# Patient Record
Sex: Female | Born: 1970 | Race: White | Hispanic: No | Smoking: Never smoker
Health system: Southern US, Community
[De-identification: ages and names within clinical notes are randomized; demographics above are authoritative.]

## PROBLEM LIST (undated history)

## (undated) DIAGNOSIS — O139 Gestational [pregnancy-induced] hypertension without significant proteinuria, unspecified trimester: Secondary | ICD-10-CM

## (undated) DIAGNOSIS — R011 Cardiac murmur, unspecified: Secondary | ICD-10-CM

## (undated) DIAGNOSIS — Z8774 Personal history of (corrected) congenital malformations of heart and circulatory system: Secondary | ICD-10-CM

## (undated) HISTORY — DX: Cardiac murmur, unspecified: R01.1

## (undated) HISTORY — DX: Personal history of (corrected) congenital malformations of heart and circulatory system: Z87.74

## (undated) HISTORY — DX: Gestational (pregnancy-induced) hypertension without significant proteinuria, unspecified trimester: O13.9

---

## 2008-06-04 ENCOUNTER — Other Ambulatory Visit: Admission: RE | Admit: 2008-06-04 | Discharge: 2008-06-04 | Payer: Self-pay | Admitting: Family Medicine

## 2008-07-30 ENCOUNTER — Ambulatory Visit (HOSPITAL_COMMUNITY): Admission: RE | Admit: 2008-07-30 | Discharge: 2008-07-30 | Payer: Self-pay | Admitting: Cardiology

## 2008-07-30 ENCOUNTER — Encounter (INDEPENDENT_AMBULATORY_CARE_PROVIDER_SITE_OTHER): Payer: Self-pay | Admitting: Cardiology

## 2008-09-17 ENCOUNTER — Encounter: Admission: RE | Admit: 2008-09-17 | Discharge: 2008-09-17 | Payer: Self-pay | Admitting: Cardiology

## 2012-02-27 ENCOUNTER — Other Ambulatory Visit (HOSPITAL_BASED_OUTPATIENT_CLINIC_OR_DEPARTMENT_OTHER): Payer: Self-pay | Admitting: Family Medicine

## 2012-02-27 DIAGNOSIS — R1031 Right lower quadrant pain: Secondary | ICD-10-CM

## 2012-02-27 DIAGNOSIS — R109 Unspecified abdominal pain: Secondary | ICD-10-CM

## 2012-02-28 ENCOUNTER — Other Ambulatory Visit (HOSPITAL_BASED_OUTPATIENT_CLINIC_OR_DEPARTMENT_OTHER): Payer: Self-pay

## 2012-02-29 ENCOUNTER — Ambulatory Visit (HOSPITAL_BASED_OUTPATIENT_CLINIC_OR_DEPARTMENT_OTHER)
Admission: RE | Admit: 2012-02-29 | Discharge: 2012-02-29 | Disposition: A | Discharge status: Home / Self Care | Payer: 59 | Source: Ambulatory Visit | Attending: Family Medicine | Admitting: Family Medicine

## 2012-02-29 DIAGNOSIS — R109 Unspecified abdominal pain: Secondary | ICD-10-CM | POA: Insufficient documentation

## 2012-02-29 DIAGNOSIS — K5730 Diverticulosis of large intestine without perforation or abscess without bleeding: Secondary | ICD-10-CM | POA: Insufficient documentation

## 2012-02-29 DIAGNOSIS — R1031 Right lower quadrant pain: Secondary | ICD-10-CM | POA: Insufficient documentation

## 2013-02-10 IMAGING — CT CT ABD-PELV W/O CM
2 of 4 series · 16 of 46 positions shown, 18 images · non-contrast
Comparison: None.

CLINICAL DATA: Right lower quadrant pain, abdominal pain

CT ABDOMEN AND PELVIS WITHOUT CONTRAST
TECHNIQUE: Multidetector CT imaging of the abdomen and pelvis was
performed following the standard protocol without intravenous
contrast.

[Series 2: renal stone < 200 lbs 5.0 b31f · axial · 0.88mm/px · z∈[-504,-49]mm · 13 of 101 slices shown, 15 images]
[im 5/101  soft-tissue]
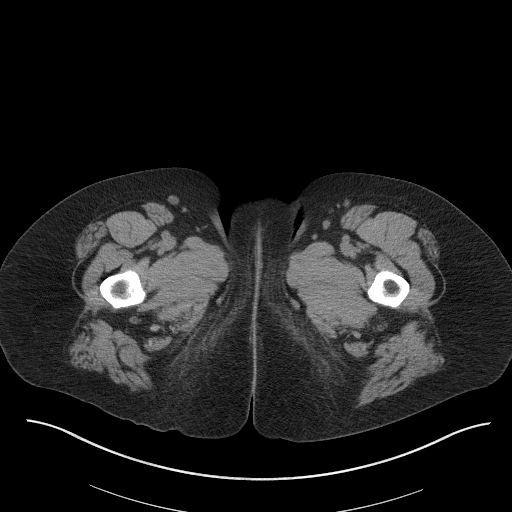
[im 5/101  bone]
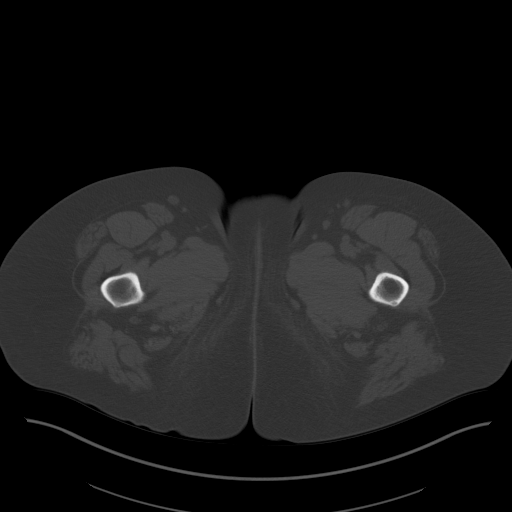
[im 13/101  soft-tissue]
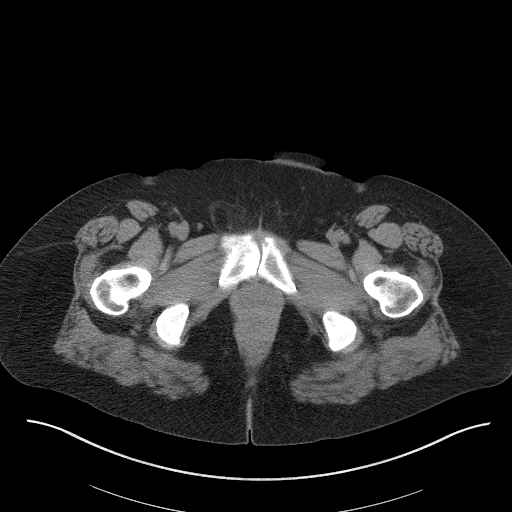
[im 21/101  soft-tissue]
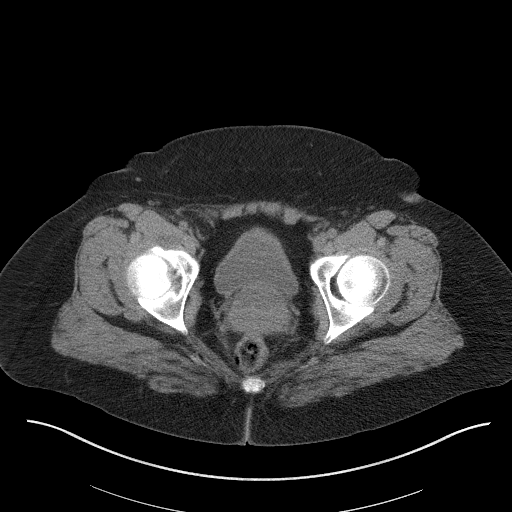
[im 30/101  soft-tissue]
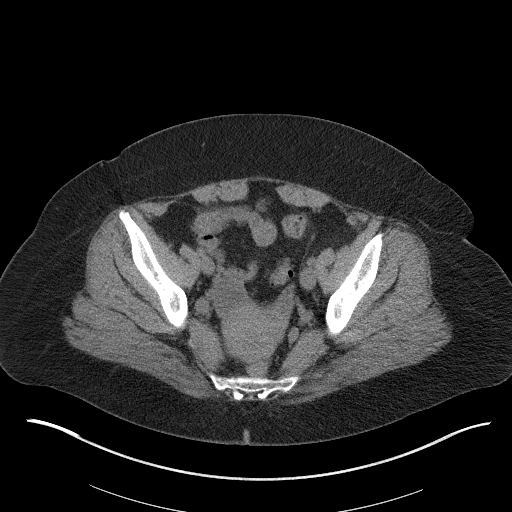
[im 34/101  soft-tissue]
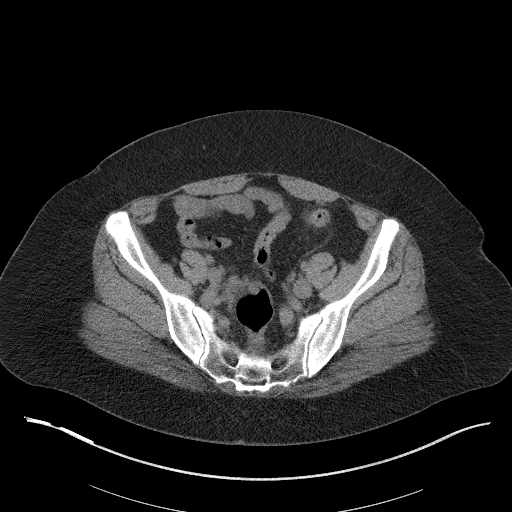
[im 42/101  soft-tissue]
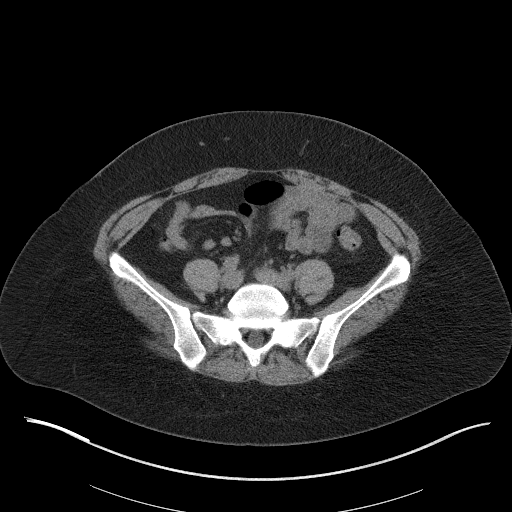
[im 51/101  soft-tissue]
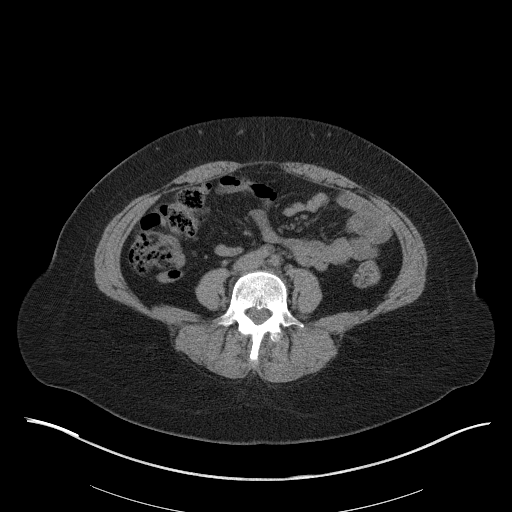
[im 59/101  soft-tissue]
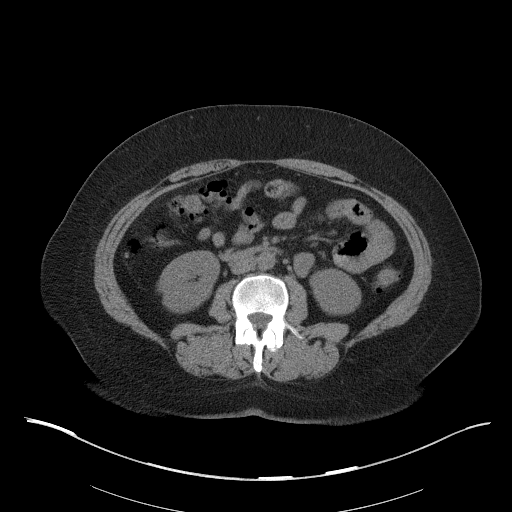
[im 67/101  soft-tissue]
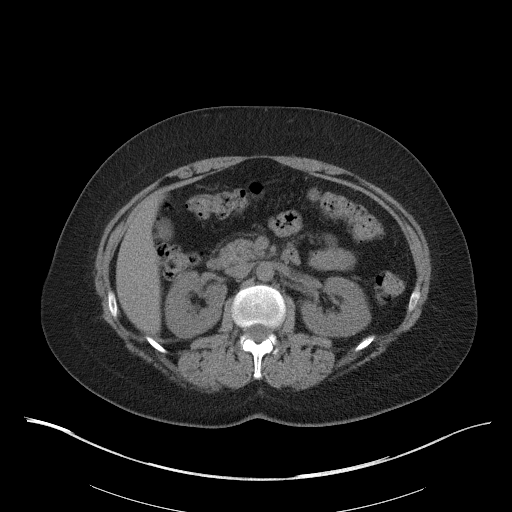
[im 67/101  bone]
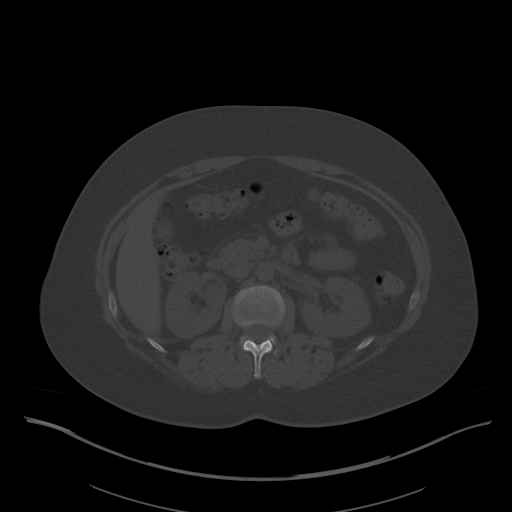
[im 71/101  soft-tissue]
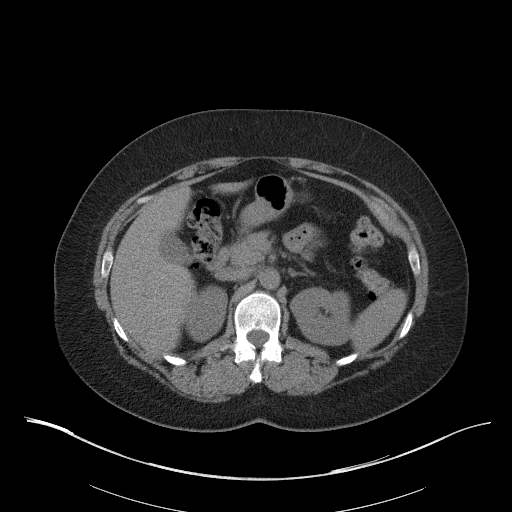
[im 80/101  soft-tissue]
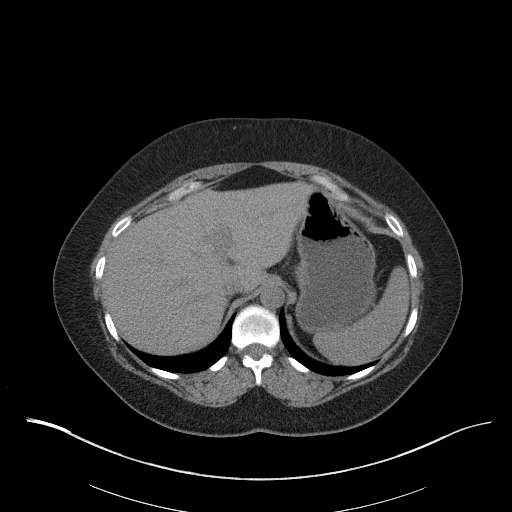
[im 88/101  soft-tissue]
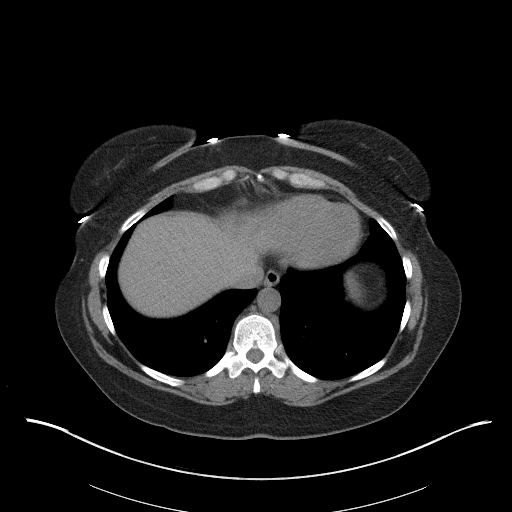
[im 96/101  soft-tissue]
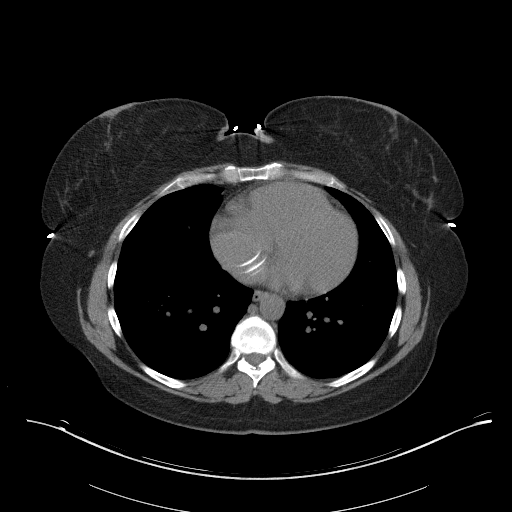

[Series 5: renal stone 3.0 coronal · coronal · 0.88mm/px · 3 of 100 slices shown]
[im 34/100  soft-tissue]
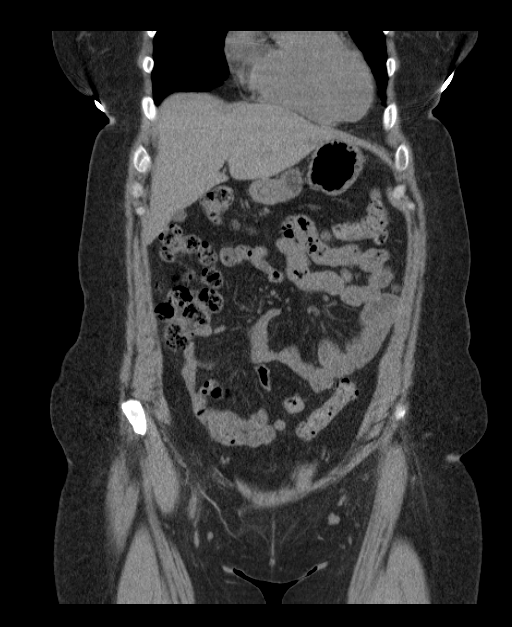
[im 45/100  soft-tissue]
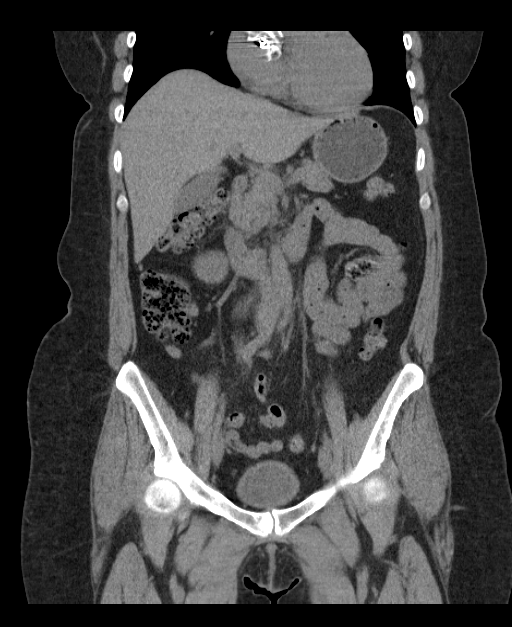
[im 56/100  soft-tissue]
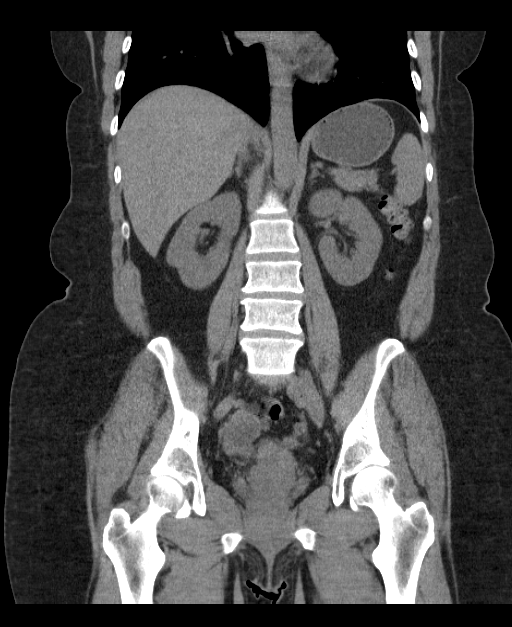

[16 of 46 positions shown; findings below may reference images not displayed]

FINDINGS: Lung bases are clear.  No pericardial fluid.

Non-IV contrast images demonstrate no focal hepatic lesion.
Gallbladder is normal.  There is mild haziness to the fat
surrounding the pancreatic head (image 29).  Pancreas parenchyma
appears normal on this noncontrast exam.  The spleen, adrenal
glands, and kidneys are normal.

The stomach, duodenum, small bowel, and cecum are normal.  Appendix
not identified.  No evidence of appendicitis.  There are several
diverticula of the descending colon and sigmoid colon without acute
inflammation.

Abdominal aorta is normal caliber.  No retroperitoneal or
periportal lymphadenopathy.

No free fluid the pelvis.  The uterus and ovaries are normal.  The
bladder is normal.  No pelvic lymphadenopathy. Review of  bone
windows demonstrates no aggressive osseous lesions.
IMPRESSION: 1..  Mild haziness surrounding pancreatic head is a nonspecific
finding.  Recommend correlation for pancreatitis or duodenitis.
2.  Colonic diverticulosis  without  diverticulitis.

## 2017-03-06 ENCOUNTER — Other Ambulatory Visit: Payer: Self-pay | Admitting: Family Medicine

## 2017-03-06 DIAGNOSIS — Z139 Encounter for screening, unspecified: Secondary | ICD-10-CM

## 2017-04-04 ENCOUNTER — Ambulatory Visit
Admission: RE | Admit: 2017-04-04 | Discharge: 2017-04-04 | Disposition: A | Discharge status: Home / Self Care | Payer: No Typology Code available for payment source | Source: Ambulatory Visit | Attending: Family Medicine | Admitting: Family Medicine

## 2017-04-04 DIAGNOSIS — Z139 Encounter for screening, unspecified: Secondary | ICD-10-CM

## 2017-05-17 ENCOUNTER — Encounter: Payer: Self-pay | Admitting: Cardiology

## 2017-05-17 ENCOUNTER — Ambulatory Visit (INDEPENDENT_AMBULATORY_CARE_PROVIDER_SITE_OTHER): Payer: No Typology Code available for payment source | Admitting: Cardiology

## 2017-05-17 VITALS — BP 128/88 | HR 72 | Ht 65.5 in | Wt 203.2 lb

## 2017-05-17 DIAGNOSIS — Q211 Atrial septal defect, unspecified: Secondary | ICD-10-CM

## 2017-05-17 DIAGNOSIS — Z8774 Personal history of (corrected) congenital malformations of heart and circulatory system: Secondary | ICD-10-CM | POA: Insufficient documentation

## 2017-05-17 DIAGNOSIS — R011 Cardiac murmur, unspecified: Secondary | ICD-10-CM

## 2017-05-17 DIAGNOSIS — O139 Gestational [pregnancy-induced] hypertension without significant proteinuria, unspecified trimester: Secondary | ICD-10-CM | POA: Insufficient documentation

## 2017-05-17 MED ORDER — ASPIRIN EC 81 MG PO TBEC: 81.0000 mg | DELAYED_RELEASE_TABLET | Freq: Every day | ORAL | 3 refills | Status: AC

## 2017-05-17 NOTE — Progress Notes (Signed)
Cardiology Office Note:    Date:  05/17/2017   ID:  Wendy CerisePamela K Legore, DOB 1971-03-22, MRN [id number]  PCP:  Lenell AntuLe, Thao P, DO  Cardiologist:  No primary care provider on file.   Referring MD: Lenell AntuLe, Thao P, DO     History of Present Illness:    Wendy Barnett is a 47 y.o. female  here for evaluation of prior atrial septal defect closure at the request of Dr. Conley RollsLe.  [facility] she had closure with occluder device.  She has been doing quite well, no fevers chills nausea vomiting syncope bleeding chest pain shortness of breath.  She lost over 100 pounds previously through physical activity, exercise, diet.  Unfortunately, has gone through a separation.  She has gained back some weight but she is down 20 pounds.  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  Her daughter is 3113.  She takes dental antibiotics.  LDL cholesterol 92 creatinine 0.72, hemoglobin 12.9  I saw her last in 2012.    Past Medical History:  Diagnosis Date  . ASD, spontaneous closure   . Heart murmur   . PIH (pregnancy induced hypertension)     Past Surgical History:  Procedure Laterality Date  . CESAREAN SECTION  2001, 2005    Current Medications: Current Meds  Medication Sig  . aspirin 325 MG tablet Take 325 mg by mouth daily.  . Multiple Vitamins-Minerals (MULTIVITAMIN ADULTS PO) Take by mouth daily.     Allergies:   Patient has no known allergies.   Social History   Socioeconomic History  . Marital status: Legally Separated    Spouse name: None  . Number of children: 2  . Years of education: None  . Highest education level: None  Social Needs  . Financial resource strain: None  . Food insecurity - worry: None  . Food insecurity - inability: None  . Transportation needs - medical: None  . Transportation needs - non-medical: None  Occupational History  . Occupation: house wife   Tobacco Use  . Smoking status: Never Smoker  . Smokeless tobacco: Never Used    Substance and Sexual Activity  . Alcohol use: Yes  . Drug use: No  . Sexual activity: None  Other Topics Concern  . None  Social History Narrative  . None     Family History: The patient's family history includes Cancer in her mother; Hyperlipidemia in her father; Prostate cancer in her father. There is no history of Breast cancer.  ROS:   Please see the history of present illness.     All other systems reviewed and are negative.  EKGs/Labs/Other Studies Reviewed:    The following studies were reviewed today: Prior office records, EKG, lab work  EKG:  EKG is ordered today.  The ekg ordered today demonstrates SB 56, no changes, personally reviewed.  Recent Labs: No results found for requested labs within last 8760 hours.  Recent Lipid Panel No results found for: CHOL, TRIG, HDL, CHOLHDL, VLDL, LDLCALC, LDLDIRECT  Physical Exam:    VS:  BP 128/88   Pulse 72   Ht 5' 5.5" (1.664 m)   Wt 203 lb 3.2 oz (92.2 kg)   SpO2 98%   BMI 33.30 kg/m     Wt Readings from Last 3 Encounters:  05/17/17 203 lb 3.2 oz (92.2 kg)     GEN:  Well nourished, well developed in no acute distress HEENT: Normal NECK: No JVD; No  carotid bruits LYMPHATICS: No lymphadenopathy CARDIAC: RRR, no murmurs, rubs, gallops RESPIRATORY:  Clear to auscultation without rales, wheezing or rhonchi  ABDOMEN: Soft, non-tender, non-distended MUSCULOSKELETAL:  No edema; No deformity  SKIN: Warm and dry NEUROLOGIC:  Alert and oriented x 3 PSYCHIATRIC:  Normal affect   ASSESSMENT:    1. Hx of percutaneous transcatheter closure of congenital ASD   2. Heart murmur   3. ASD (atrial septal defect)    PLAN:    In order of problems listed above:  ASD status post ASD closure device percutaneously in 2010 at [facility] to do well.  We will check an echocardiogram to ensure proper function. -Continue with dental antibiotics. -Okay to decrease aspirin to 81 mg a day.  Obesity -She is  continuing to work on diet and exercise.     Medication Adjustments/Labs and Tests Ordered: Current medicines are reviewed at length with the patient today.  Concerns regarding medicines are outlined above.  No orders of the defined types were placed in this encounter.  No orders of the defined types were placed in this encounter.   Signed, Donato Schultz, MD  05/17/2017 11:10 AM    [facility]

## 2017-05-17 NOTE — Patient Instructions (Signed)
Medication Instructions:  Please decrease your Asprin to 81 mg a day. Continue all other medications as listed.  Testing/Procedures: Your physician has requested that you have an echocardiogram. Echocardiography is a painless test that uses sound waves to create images of your heart. It provides your doctor with information about the size and shape of your heart and how well your heart's chambers and valves are working. This procedure takes approximately one hour. There are no restrictions for this procedure.  Follow-Up: Follow up in 2 years with Dr. Anne FuSkains.  You will receive a letter in the mail 2 months before you are due.  Please call us when you receive this letter to schedule your follow up appointment.  If you need a refill on your cardiac medications before your next appointment, please call your pharmacy.  Thank you for choosing [facility]!!

## 2017-05-24 ENCOUNTER — Other Ambulatory Visit: Payer: Self-pay

## 2017-05-24 ENCOUNTER — Ambulatory Visit (HOSPITAL_COMMUNITY): Payer: No Typology Code available for payment source | Attending: Internal Medicine

## 2017-05-24 DIAGNOSIS — Q211 Atrial septal defect, unspecified: Secondary | ICD-10-CM

## 2017-05-24 DIAGNOSIS — R011 Cardiac murmur, unspecified: Secondary | ICD-10-CM | POA: Diagnosis not present

## 2017-05-24 DIAGNOSIS — I351 Nonrheumatic aortic (valve) insufficiency: Secondary | ICD-10-CM | POA: Diagnosis not present

## 2017-05-24 DIAGNOSIS — Z8774 Personal history of (corrected) congenital malformations of heart and circulatory system: Secondary | ICD-10-CM | POA: Insufficient documentation

## 2019-03-19 ENCOUNTER — Telehealth: Payer: Self-pay | Admitting: Cardiology

## 2019-03-19 NOTE — Telephone Encounter (Signed)
New Message   Patient wants to know if need an echo before she sets up her next appt with Dr. Marlou Porch. Please advise.

## 2019-03-20 ENCOUNTER — Telehealth: Payer: Self-pay | Admitting: Cardiology

## 2019-03-20 NOTE — Telephone Encounter (Signed)
No need for echocardiogram before appointment.  Last echocardiogram reassuring. Wendy Furbish, MD

## 2019-03-20 NOTE — Telephone Encounter (Signed)
Please see previous phone note.  

## 2019-03-20 NOTE — Telephone Encounter (Signed)
Attempted to contact pt to make her aware she does not need a 2 D Echo this year per Dr Marlou Porch. NA and mailbox is full therefore unable to leave a message.

## 2019-03-20 NOTE — Telephone Encounter (Signed)
Pt last seen 05/17/2017 - was instructed to f/u in 2 years (due 05/2019).  She has 2 D Echo2/14/2019 demonstrating NL functioning ASD closure device, mild aortic regurg, overall reassuring.   Will have Dr Andre Lefort to determine if she needs another echo before seeing him back.

## 2019-03-20 NOTE — Telephone Encounter (Signed)
New Message  Pt is calling and wanting to talk to Dr. Marlou Porch nurse about how to schedule an appt.   Please call to discuss

## 2019-03-24 NOTE — Telephone Encounter (Signed)
Left message for patient that per Dr Marlou Porch she does not need a 2 D Echo before she appt with him this year.  Requested she call back if any questions or concerns.

## 2019-05-15 ENCOUNTER — Other Ambulatory Visit: Payer: Self-pay

## 2019-05-15 ENCOUNTER — Encounter: Payer: Self-pay | Admitting: Cardiology

## 2019-05-15 ENCOUNTER — Ambulatory Visit: Payer: BLUE CROSS/BLUE SHIELD | Admitting: Cardiology

## 2019-05-15 VITALS — BP 130/90 | HR 55 | Ht 65.5 in | Wt 140.0 lb

## 2019-05-15 DIAGNOSIS — Q211 Atrial septal defect, unspecified: Secondary | ICD-10-CM

## 2019-05-15 DIAGNOSIS — Z8774 Personal history of (corrected) congenital malformations of heart and circulatory system: Secondary | ICD-10-CM

## 2019-05-15 NOTE — Patient Instructions (Signed)
Medication Instructions:  The current medical regimen is effective;  continue present plan and medications.  *If you need a refill on your cardiac medications before your next appointment, please call your pharmacy*  Follow-Up: At [facility], you and your health needs are our priority.  As part of our continuing mission to provide you with exceptional heart care, we have created designated Provider Care Teams.  These Care Teams include your primary Cardiologist (physician) and Advanced Practice Providers (APPs -  Physician Assistants and Nurse Practitioners) who all work together to provide you with the care you need, when you need it.  Your next appointment:   12 month(s)  The format for your next appointment:   In Person  Provider:   Mark Skains, MD   Thank you for choosing [facility]!!     

## 2019-05-15 NOTE — Progress Notes (Signed)
Cardiology Office Note:    Date:  05/15/2019   ID:  Wendy Barnett, DOB March 13, 1971, MRN [id number]  PCP:  Glenford Bayley, DO  Cardiologist:  Candee Furbish, MD   Referring MD: Glenford Bayley, DO     History of Present Illness:    Wendy Barnett is a 49 y.o. female  here for follow-up of prior ASD closure with Amplatzer occluder device.  2010, [employer].  Lost over 100 pounds.  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  Daughter 33.  Dental antibiotics.  Echocardiogram in 2019 was reassuring.  Dropping weight during COVID.   Past Medical History:  Diagnosis Date  . ASD, spontaneous closure   . Heart murmur   . PIH (pregnancy induced hypertension)     Past Surgical History:  Procedure Laterality Date  . CESAREAN SECTION  2001, 2005    Current Medications: Current Meds  Medication Sig  . aspirin EC 81 MG tablet Take 1 tablet (81 mg total) by mouth daily.     Allergies:   Patient has no known allergies.   Social History   Socioeconomic History  . Marital status: Legally Separated    Spouse name: Not on file  . Number of children: 2  . Years of education: Not on file  . Highest education level: Not on file  Occupational History  . Occupation: house wife   Tobacco Use  . Smoking status: Never Smoker  . Smokeless tobacco: Never Used  Substance and Sexual Activity  . Alcohol use: Yes  . Drug use: No  . Sexual activity: Not on file  Other Topics Concern  . Not on file  Social History Narrative  . Not on file   Social Determinants of Health   Financial Resource Strain:   . Difficulty of Paying Living Expenses: Not on file  Food Insecurity:   . Worried About Charity fundraiser in the Last Year: Not on file  . Ran Out of Food in the Last Year: Not on file  Transportation Needs:   . Lack of Transportation (Medical): Not on file  . Lack of Transportation (Non-Medical): Not on file  Physical Activity:   . Days of Exercise per Week: Not on file  . Minutes of  Exercise per Session: Not on file  Stress:   . Feeling of Stress : Not on file  Social Connections:   . Frequency of Communication with Friends and Family: Not on file  . Frequency of Social Gatherings with Friends and Family: Not on file  . Attends Religious Services: Not on file  . Active Member of Clubs or Organizations: Not on file  . Attends Archivist Meetings: Not on file  . Marital Status: Not on file     Family History: The patient's family history includes Cancer in her mother; Hyperlipidemia in her father; Prostate cancer in her father. There is no history of Breast cancer.  ROS:   Please see the history of present illness.     All other systems reviewed and are negative.  EKGs/Labs/Other Studies Reviewed:    The following studies were reviewed today: Prior office records, EKG, lab work  Echocardiogram 05/24/2017: Normal functioning ASD closuring device. Mild aortic regurgitation. Overall reassuring.   EKG:  EKG is ordered today.  The ekg ordered today demonstrates SB 56, no changes, personally reviewed.  Recent Labs: No results found for requested labs within last 8760 hours.  Recent Lipid Panel No results found for: CHOL, TRIG,  HDL, CHOLHDL, VLDL, LDLCALC, LDLDIRECT  Physical Exam:    VS:  BP 130/90   Pulse (!) 55   Ht 5' 5.5" (1.664 m)   Wt 140 lb (63.5 kg)   BMI 22.94 kg/m     Wt Readings from Last 3 Encounters:  05/15/19 140 lb (63.5 kg)  05/17/17 203 lb 3.2 oz (92.2 kg)     GEN: Well nourished, well developed, in no acute distress  HEENT: normal  Neck: no JVD, carotid bruits, or masses Cardiac: RRR; no murmurs, rubs, or gallops,no edema  Respiratory:  clear to auscultation bilaterally, normal work of breathing GI: soft, nontender, nondistended, + BS MS: no deformity or atrophy  Skin: warm and dry, no rash Neuro:  Alert and Oriented x 3, Strength and sensation are intact Psych: euthymic mood, full affect   ASSESSMENT:    1. ASD  (atrial septal defect)   2. Hx of percutaneous transcatheter closure of congenital ASD    PLAN:    In order of problems listed above:  Amplatzer ASD closure device 2010 [facility] -Doing well.  Prior echocardiogram showed no evidence of shunting. -Dental antibiotics.  Aspirin 81 mg a day seems reasonable.   Medication Adjustments/Labs and Tests Ordered: Current medicines are reviewed at length with the patient today.  Concerns regarding medicines are outlined above.  Orders Placed This Encounter  Procedures  . EKG 12-Lead   No orders of the defined types were placed in this encounter.   Signed, Donato Schultz, MD  05/15/2019 12:23 PM    [facility]

## 2019-06-07 ENCOUNTER — Ambulatory Visit: Payer: BLUE CROSS/BLUE SHIELD | Attending: Internal Medicine

## 2019-06-07 DIAGNOSIS — Z23 Encounter for immunization: Secondary | ICD-10-CM | POA: Insufficient documentation

## 2019-06-07 NOTE — Progress Notes (Signed)
   Covid-19 Vaccination Clinic  Name:  Wendy Barnett    MRN: [id number] DOB: Dec 03, 1970  06/07/2019  [name] was observed post Covid-19 immunization for 15 minutes without incidence. She was provided with Vaccine Information Sheet and instruction to access the V-Safe system.   [name] was instructed to call 911 with any severe reactions post vaccine: Marland Kitchen Difficulty breathing  . Swelling of your face and throat  . A fast heartbeat  . A bad rash all over your body  . Dizziness and weakness    Immunizations Administered    Name Date Dose VIS Date Route   Pfizer COVID-19 Vaccine 06/07/2019  3:47 PM 0.3 mL 03/21/2019 Intramuscular   Manufacturer: [employer], [employer]   Lot: [id number]   NDC: [address]-7

## 2019-06-28 ENCOUNTER — Ambulatory Visit: Payer: BLUE CROSS/BLUE SHIELD | Attending: Internal Medicine

## 2019-06-28 DIAGNOSIS — Z23 Encounter for immunization: Secondary | ICD-10-CM

## 2019-06-28 NOTE — Progress Notes (Signed)
   Covid-19 Vaccination Clinic  Name:  Wendy Barnett    MRN: [id number] DOB: 09/18/1970  06/28/2019  [name] was observed post Covid-19 immunization for 15 minutes without incident. She was provided with Vaccine Information Sheet and instruction to access the V-Safe system.   [name] was instructed to call 911 with any severe reactions post vaccine: Marland Kitchen Difficulty breathing  . Swelling of face and throat  . A fast heartbeat  . A bad rash all over body  . Dizziness and weakness   Immunizations Administered    Name Date Dose VIS Date Route   Pfizer COVID-19 Vaccine 06/28/2019  3:38 PM 0.3 mL 03/21/2019 Intramuscular   Manufacturer: [employer], [employer]   Lot: [id number]   NDC: [address]-7

## 2020-05-24 ENCOUNTER — Telehealth: Payer: Self-pay | Admitting: Cardiology

## 2020-05-24 DIAGNOSIS — Q211 Atrial septal defect, unspecified: Secondary | ICD-10-CM

## 2020-05-24 DIAGNOSIS — I351 Nonrheumatic aortic (valve) insufficiency: Secondary | ICD-10-CM

## 2020-05-24 NOTE — Telephone Encounter (Signed)
Last echo was 05/2017  Normal functioning ASD closuring device. Mild aortic regurgitation. Overall reassuring.  Donato Schultz, MD  appt scheduled with MS 05/31/2020.  Will have MS to review for orders but highly doubtful we could schedule the echo before she upcoming appt.

## 2020-05-24 NOTE — Telephone Encounter (Signed)
Patient wanted to know if she needed an Echo before her next appointment with Dr. Anne Fu. Please advise

## 2020-05-26 NOTE — Telephone Encounter (Signed)
Please order ECHO (aortic regurgitation and prior ASD closure) OK if can't get prior to appt.  Donato Schultz, MD

## 2020-05-26 NOTE — Telephone Encounter (Signed)
Order has been placed for echo as instructed.

## 2020-05-28 NOTE — Telephone Encounter (Signed)
Echo has been scheduled for 06/17/20

## 2020-05-31 ENCOUNTER — Ambulatory Visit: Payer: BLUE CROSS/BLUE SHIELD | Admitting: Cardiology

## 2020-06-17 ENCOUNTER — Ambulatory Visit (HOSPITAL_COMMUNITY): Payer: BLUE CROSS/BLUE SHIELD | Attending: Cardiology

## 2020-06-17 ENCOUNTER — Other Ambulatory Visit: Payer: Self-pay

## 2020-06-17 ENCOUNTER — Telehealth: Payer: Self-pay | Admitting: Cardiology

## 2020-06-17 DIAGNOSIS — Q211 Atrial septal defect, unspecified: Secondary | ICD-10-CM

## 2020-06-17 DIAGNOSIS — I351 Nonrheumatic aortic (valve) insufficiency: Secondary | ICD-10-CM | POA: Insufficient documentation

## 2020-06-17 NOTE — Telephone Encounter (Signed)
    Pt has to r/s her appt with Dr. Anne Fu due to conflict in her schedule at work. She wanted to request once her echo result available if Dr. Anne Fu or his nurse can give her an in-depth explanation of her result

## 2020-06-18 LAB — ECHOCARDIOGRAM COMPLETE
Area-P 1/2: 3.99 cm2
P 1/2 time: 717 msec
S' Lateral: 3.2 cm

## 2020-06-18 NOTE — Telephone Encounter (Signed)
The patient had her echo 06/17/20. Her appointment with Dr. Anne Fu in now in May. Reiterated to her that she will be called to discuss echo results and will not have to wait until her May appointment. She was grateful for call and agrees with plan.

## 2020-06-21 ENCOUNTER — Other Ambulatory Visit (HOSPITAL_COMMUNITY): Payer: BLUE CROSS/BLUE SHIELD

## 2020-06-21 NOTE — Telephone Encounter (Signed)
Patient is calling back for echo results Florentina Addison called her today

## 2020-07-15 ENCOUNTER — Ambulatory Visit: Payer: BLUE CROSS/BLUE SHIELD | Admitting: Cardiology

## 2020-08-23 ENCOUNTER — Encounter: Payer: Self-pay | Admitting: Cardiology

## 2020-08-23 ENCOUNTER — Ambulatory Visit (INDEPENDENT_AMBULATORY_CARE_PROVIDER_SITE_OTHER): Payer: BLUE CROSS/BLUE SHIELD | Admitting: Cardiology

## 2020-08-23 ENCOUNTER — Other Ambulatory Visit: Payer: Self-pay

## 2020-08-23 VITALS — BP 120/90 | HR 45 | Ht 65.0 in | Wt 160.0 lb

## 2020-08-23 DIAGNOSIS — Q211 Atrial septal defect, unspecified: Secondary | ICD-10-CM

## 2020-08-23 DIAGNOSIS — I351 Nonrheumatic aortic (valve) insufficiency: Secondary | ICD-10-CM

## 2020-08-23 DIAGNOSIS — Z8774 Personal history of (corrected) congenital malformations of heart and circulatory system: Secondary | ICD-10-CM | POA: Diagnosis not present

## 2020-08-23 NOTE — Progress Notes (Signed)
Cardiology Office Note:    Date:  08/23/2020   ID:  Wendy Barnett, DOB 09-28-1970, MRN [id number]  PCP:  Lenell Antu, DO   [facility] Providers Cardiologist:  Donato Schultz, MD     Referring MD: Lenell Antu, DO    History of Present Illness:    Wendy Barnett is a 50 y.o. female here to discuss results of echocardiogram performed on 06/17/2020.  ASD closure with Amplatzer occluder device.  2010, [employer].  Lost over 100 pounds.  Owner of theater production company.  Daughter teen.  Dental antibiotics.  Echocardiogram in 2019 and 2022 was reassuring.  I had originally seen her in 2012.  Overall she is doing well, no fevers chills nausea vomiting syncope bleeding.  Has been maintaining weight loss.  Going up and down stairs at work.  Feels well.    Past Medical History:  Diagnosis Date  . ASD, spontaneous closure   . Heart murmur   . PIH (pregnancy induced hypertension)     Past Surgical History:  Procedure Laterality Date  . CESAREAN SECTION  2001, 2005    Current Medications: Current Meds  Medication Sig  . aspirin EC 81 MG tablet Take 1 tablet (81 mg total) by mouth daily.     Allergies:   Patient has no known allergies.   Social History   Socioeconomic History  . Marital status: Legally Separated    Spouse name: Not on file  . Number of children: 2  . Years of education: Not on file  . Highest education level: Not on file  Occupational History  . Occupation: house wife   Tobacco Use  . Smoking status: Never Smoker  . Smokeless tobacco: Never Used  Vaping Use  . Vaping Use: Never used  Substance and Sexual Activity  . Alcohol use: Yes  . Drug use: No  . Sexual activity: Not on file  Other Topics Concern  . Not on file  Social History Narrative  . Not on file   Social Determinants of Health   Financial Resource Strain: Not on file  Food Insecurity: Not on file  Transportation Needs: Not on file  Physical Activity: Not on file   Stress: Not on file  Social Connections: Not on file     Family History: The patient's family history includes Cancer in her mother; Hyperlipidemia in her father; Prostate cancer in her father. There is no history of Breast cancer.  ROS:   Please see the history of present illness.     All other systems reviewed and are negative.  EKGs/Labs/Other Studies Reviewed:    The following studies were reviewed today:  ECHO 06/17/20:  1. Left ventricular ejection fraction, by estimation, is 60 to 65%. The  left ventricle has normal function. The left ventricle has no regional  wall motion abnormalities. Left ventricular diastolic parameters are  consistent with Grade I diastolic  dysfunction (impaired relaxation). The average left ventricular global  longitudinal strain is -25.6 %. The global longitudinal strain is normal.  2. Right ventricular systolic function is normal. The right ventricular  size is normal.  3. Left atrial size was mildly dilated.  4. Amplatzer device in place, no shunting.  5. The mitral valve is normal in structure. Trivial mitral valve  regurgitation. No evidence of mitral stenosis.  6. The aortic valve is normal in structure. Aortic valve regurgitation is  mild. No aortic stenosis is present.  7. The inferior vena cava is normal in  size with greater than 50%  respiratory variability, suggesting right atrial pressure of 3 mmHg.   Comparison(s): 05/24/17 EF 55-60%. Mild AI.   EKG:  EKG is  ordered today.  The ekg ordered today demonstrates sinus bradycardia 45 with no other changes.  Recent Labs: No results found for requested labs within last 8760 hours.  Recent Lipid Panel No results found for: CHOL, TRIG, HDL, CHOLHDL, VLDL, LDLCALC, LDLDIRECT   Risk Assessment/Calculations:      Physical Exam:    VS:  BP 120/90   Pulse (!) 45   Ht 5\' 5"  (1.651 m)   Wt 160 lb (72.6 kg)   SpO2 99%   BMI 26.63 kg/m     Wt Readings from Last 3 Encounters:   08/23/20 160 lb (72.6 kg)  05/15/19 140 lb (63.5 kg)  05/17/17 203 lb 3.2 oz (92.2 kg)     GEN:  Well nourished, well developed in no acute distress HEENT: Normal NECK: No JVD; No carotid bruits LYMPHATICS: No lymphadenopathy CARDIAC: RRR, no murmurs, rubs, gallops RESPIRATORY:  Clear to auscultation without rales, wheezing or rhonchi  ABDOMEN: Soft, non-tender, non-distended MUSCULOSKELETAL:  No edema; No deformity  SKIN: Warm and dry NEUROLOGIC:  Alert and oriented x 3 PSYCHIATRIC:  Normal affect   ASSESSMENT:    1. ASD (atrial septal defect)   2. Nonrheumatic aortic valve insufficiency   3. Hx of percutaneous transcatheter closure of congenital ASD    PLAN:    In order of problems listed above:  ASD closure - Excellent.  No evidence of shunt on echocardiogram.  Personally reviewed and interpreted.  Aortic regurgitation - Mild aortic regurgitation.  Excellent.  Sinus bradycardia - EKG personally reviewed and interpreted shows heart rate of 45 bpm..  She is on no AV nodal blocking agents.  Doing well.  Asymptomatic with this.  Weight - Excellent job.  Continue with low-dose aspirin 81 mg medical management given prior closure device, Amplatz and dental prophylaxis as well.  She has not had any complications/bleeding.  1 year follow-up.   Medication Adjustments/Labs and Tests Ordered: Current medicines are reviewed at length with the patient today.  Concerns regarding medicines are outlined above.  Orders Placed This Encounter  Procedures  . EKG 12-Lead   No orders of the defined types were placed in this encounter.   Patient Instructions  Medication Instructions:  Your physician recommends that you continue on your current medications as directed. Please refer to the Current Medication list given to you today.  *If you need a refill on your cardiac medications before your next appointment, please call your pharmacy*   Lab Work: None If you have labs  (blood work) drawn today and your tests are completely normal, you will receive your results only by: 07/15/17 MyChart Message (if you have MyChart) OR . A paper copy in the mail If you have any lab test that is abnormal or we need to change your treatment, we will call you to review the results.   Testing/Procedures: None   Follow-Up: At [facility], you and your health needs are our priority.  As part of our continuing mission to provide you with exceptional heart care, we have created designated Provider Care Teams.  These Care Teams include your primary Cardiologist (physician) and Advanced Practice Providers (APPs -  Physician Assistants and Nurse Practitioners) who all work together to provide you with the care you need, when you need it.  We recommend signing up for the patient portal  called "MyChart".  Sign up information is provided on this After Visit Summary.  MyChart is used to connect with patients for Virtual Visits (Telemedicine).  Patients are able to view lab/test results, encounter notes, upcoming appointments, etc.  Non-urgent messages can be sent to your provider as well.   To learn more about what you can do with MyChart, go to [email or web address].    Your next appointment:   1 year(s)  The format for your next appointment:   In Person  Provider:   You may see Donato Schultz, MD or one of the following Advanced Practice Providers on your designated Care Team:    Georgie Chard, NP    Other Instructions      Signed, Donato Schultz, MD  08/23/2020 9:28 AM    [facility]

## 2020-08-23 NOTE — Patient Instructions (Signed)
Medication Instructions:  Your physician recommends that you continue on your current medications as directed. Please refer to the Current Medication list given to you today.  *If you need a refill on your cardiac medications before your next appointment, please call your pharmacy*   Lab Work: None If you have labs (blood work) drawn today and your tests are completely normal, you will receive your results only by: . MyChart Message (if you have MyChart) OR . A paper copy in the mail If you have any lab test that is abnormal or we need to change your treatment, we will call you to review the results.   Testing/Procedures: None   Follow-Up: At [facility], you and your health needs are our priority.  As part of our continuing mission to provide you with exceptional heart care, we have created designated Provider Care Teams.  These Care Teams include your primary Cardiologist (physician) and Advanced Practice Providers (APPs -  Physician Assistants and Nurse Practitioners) who all work together to provide you with the care you need, when you need it.  We recommend signing up for the patient portal called "MyChart".  Sign up information is provided on this After Visit Summary.  MyChart is used to connect with patients for Virtual Visits (Telemedicine).  Patients are able to view lab/test results, encounter notes, upcoming appointments, etc.  Non-urgent messages can be sent to your provider as well.   To learn more about what you can do with MyChart, go to [email or web address].    Your next appointment:   1 year(s)  The format for your next appointment:   In Person  Provider:   You may see Mark Skains, MD or one of the following Advanced Practice Providers on your designated Care Team:    Jill McDaniel, NP    Other Instructions   

## 2020-11-29 ENCOUNTER — Telehealth: Payer: Self-pay | Admitting: Cardiology

## 2020-11-29 NOTE — Telephone Encounter (Signed)
Pt of Dr Anne Fu with history of ASD closure with Amplatzer occluder device.  Pt states she has had an elbow injury and was prescribed a steroid dose pack and advised she could take the dose pack or Ibuprofen.  Pt states her elbow is very swollen.  Pt is calling to ask if it is OK for her to take either the steroid dose pack or the Ibuprofen for her injury with her history and having to take Aspirin.   Pt advised Dr Anne Fu is out of the office this week but will forward to our pharmacy team for review and recommendation.  Pt verbalizes understanding and agrees with current plan.

## 2020-11-29 NOTE — Telephone Encounter (Signed)
Patient called in with question concerning medication. Please advise

## 2020-11-30 NOTE — Telephone Encounter (Signed)
Ok to take either one short term, but I think the steroid back would be the safer option given she is on ASA.

## 2020-11-30 NOTE — Telephone Encounter (Signed)
Left message for patient to call back  

## 2020-11-30 NOTE — Telephone Encounter (Signed)
Spoke with the patient and gave her recommendations from PharmD. Patient verbalized understanding 

## 2020-11-30 NOTE — Telephone Encounter (Signed)
Patient was returning call 

## 2022-01-24 ENCOUNTER — Encounter: Payer: Self-pay | Admitting: Physician Assistant

## 2022-01-24 ENCOUNTER — Ambulatory Visit: Payer: BLUE CROSS/BLUE SHIELD | Attending: Physician Assistant | Admitting: Physician Assistant

## 2022-01-24 VITALS — BP 150/90 | HR 58 | Ht 66.0 in | Wt 211.2 lb

## 2022-01-24 DIAGNOSIS — I351 Nonrheumatic aortic (valve) insufficiency: Secondary | ICD-10-CM | POA: Diagnosis not present

## 2022-01-24 DIAGNOSIS — Q211 Atrial septal defect, unspecified: Secondary | ICD-10-CM

## 2022-01-24 DIAGNOSIS — R030 Elevated blood-pressure reading, without diagnosis of hypertension: Secondary | ICD-10-CM | POA: Diagnosis not present

## 2022-01-24 NOTE — Progress Notes (Signed)
Cardiology Office Note:    Date:  01/24/2022   ID:  Wendy Barnett, DOB June 28, 1970, MRN [id number]  PCP:  Lenell Antu, DO  [facility] Cardiologist:  Donato Schultz, MD  [facility] Electrophysiologist:  None   Chief Complaint: 18 months follow up   History of Present Illness:    Wendy Barnett is a 51 y.o. female with a hx of ASD s/p closure with Amplatzer occluder device (2010, [employer]), mild aortic regurgitation and sinus bradycardia seen for follow-up.  Echocardiogram 2022 showed normal LV function at 60 to 65%, grade 1 diastolic dysfunction, mild aortic regurgitation.  ASD device in place without shunt.  Last seen by Dr. Anne Fu March 2022.  Noted sinus bradycardia without AV nodal ablation gated.  Patient is here for follow-up.  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  Denies chest pain, shortness of breath, orthopnea, PND, syncope, lower extremity edema or melena.  Had minimally elevated blood pressure at PCP office.  Blood pressure elevated here.  She may have underlying stress/anxiety.  Past Medical History:  Diagnosis Date   ASD, spontaneous closure    Heart murmur    PIH (pregnancy induced hypertension)     Past Surgical History:  Procedure Laterality Date   CESAREAN SECTION  2001, 2005    Current Medications: Current Meds  Medication Sig   aspirin EC 81 MG tablet Take 1 tablet (81 mg total) by mouth daily.     Allergies:   Patient has no known allergies.   Social History   Socioeconomic History   Marital status: Legally Separated    Spouse name: Not on file   Number of children: 2   Years of education: Not on file   Highest education level: Not on file  Occupational History   Occupation: house wife   Tobacco Use   Smoking status: Never   Smokeless tobacco: Never  Vaping Use   Vaping Use: Never used  Substance and Sexual Activity   Alcohol use: Yes   Drug use: No   Sexual activity: Not on file   Other Topics Concern   Not on file  Social History Narrative   Not on file   Social Determinants of Health   Financial Resource Strain: Not on file  Food Insecurity: Not on file  Transportation Needs: Not on file  Physical Activity: Unknown (05/17/2017)   Exercise Vital Sign    Days of Exercise per Week: 6 days    Minutes of Exercise per Session: Not on file  Stress: Not on file  Social Connections: Not on file     Family History: The patient's family history includes Cancer in her mother; Hyperlipidemia in her father; Prostate cancer in her father. There is no history of Breast cancer.    ROS:   Please see the history of present illness.    All other systems reviewed and are negative.   EKGs/Labs/Other Studies Reviewed:    The following studies were reviewed today:  ECHO 06/17/20:  1. Left ventricular ejection fraction, by estimation, is 60 to 65%. The  left ventricle has normal function. The left ventricle has no regional  wall motion abnormalities. Left ventricular diastolic parameters are  consistent with Grade I diastolic  dysfunction (impaired relaxation). The average left ventricular global  longitudinal strain is -25.6 %. The global longitudinal strain is normal.   2. Right ventricular systolic function is normal. The right ventricular  size is normal.  3. Left atrial size was mildly dilated.   4. Amplatzer device in place, no shunting.   5. The mitral valve is normal in structure. Trivial mitral valve  regurgitation. No evidence of mitral stenosis.   6. The aortic valve is normal in structure. Aortic valve regurgitation is  mild. No aortic stenosis is present.   7. The inferior vena cava is normal in size with greater than 50%  respiratory variability, suggesting right atrial pressure of 3 mmHg.   Comparison(s): 05/24/17 EF 55-60%. Mild AI.   EKG:  EKG is  ordered today.  The ekg ordered today demonstrates normal sinus rhythm  Recent Labs: No results found  for requested labs within last 365 days.  Recent Lipid Panel No results found for: "CHOL", "TRIG", "HDL", "CHOLHDL", "VLDL", "LDLCALC", "LDLDIRECT"   Physical Exam:    VS:  BP (!) 150/90   Pulse (!) 58   Ht 5\' 6"  (1.676 m)   Wt 211 lb 3.2 oz (95.8 kg)   SpO2 98%   BMI 34.09 kg/m     Wt Readings from Last 3 Encounters:  01/24/22 211 lb 3.2 oz (95.8 kg)  08/23/20 160 lb (72.6 kg)  05/15/19 140 lb (63.5 kg)     GEN:  Well nourished, well developed in no acute distress HEENT: Normal NECK: No JVD; No carotid bruits LYMPHATICS: No lymphadenopathy CARDIAC: RRR, no murmurs, rubs, gallops RESPIRATORY:  Clear to auscultation without rales, wheezing or rhonchi  ABDOMEN: Soft, non-tender, non-distended MUSCULOSKELETAL:  No edema; No deformity  SKIN: Warm and dry NEUROLOGIC:  Alert and oriented x 3 PSYCHIATRIC:  Normal affect   ASSESSMENT AND PLAN:    Elevated blood pressure without hypertension Patient with elevated blood pressure today 140/88>>150/90.  Also had elevated blood pressure during office visit with PCP. Discussed pathophysiology of elevated blood pressure/hypertension.  She is hesitant to start medication.  Discussed low-sodium diet, weight loss and regular exercise.  She will get blood pressure cuff and send 07/13/19 readings for review.  Discussed when to take blood pressure. Hx of elevated blood pressure during pregnancy.   2.  ASD s/p closure -No residual shunt on echocardiogram last year  Medication Adjustments/Labs and Tests Ordered: Current medicines are reviewed at length with the patient today.  Concerns regarding medicines are outlined above.  Orders Placed This Encounter  Procedures   EKG 12-Lead   No orders of the defined types were placed in this encounter.   Patient Instructions  Medication Instructions:  Your physician recommends that you continue on your current medications as directed. Please refer to the Current Medication list given to you today. *If  you need a refill on your cardiac medications before your next appointment, please call your pharmacy*   Lab Work: None Ordered   Testing/Procedures: None Ordered   Follow-Up: At [facility], you and your health needs are our priority.  As part of our continuing mission to provide you with exceptional heart care, we have created designated Provider Care Teams.  These Care Teams include your primary Cardiologist (physician) and Advanced Practice Providers (APPs -  Physician Assistants and Nurse Practitioners) who all work together to provide you with the care you need, when you need it.  We recommend signing up for the patient portal called "MyChart".  Sign up information is provided on this After Visit Summary.  MyChart is used to connect with patients for Virtual Visits (Telemedicine).  Patients are able to view lab/test results, encounter notes, upcoming appointments, etc.  Non-urgent  messages can be sent to your provider as well.   To learn more about what you can do with MyChart, go to [email or web address].    Your next appointment:   2 month(s)  The format for your next appointment:   In Person  Provider:   Melina Copa, PA-C        Other Instructions   Important Information About Sugar         Signed, Leanor Kail, PA  01/24/2022 3:15 PM    [facility]

## 2022-01-24 NOTE — Patient Instructions (Signed)
Medication Instructions:  Your physician recommends that you continue on your current medications as directed. Please refer to the Current Medication list given to you today. *If you need a refill on your cardiac medications before your next appointment, please call your pharmacy*   Lab Work: None Ordered   Testing/Procedures: None Ordered   Follow-Up: At [facility], you and your health needs are our priority.  As part of our continuing mission to provide you with exceptional heart care, we have created designated Provider Care Teams.  These Care Teams include your primary Cardiologist (physician) and Advanced Practice Providers (APPs -  Physician Assistants and Nurse Practitioners) who all work together to provide you with the care you need, when you need it.  We recommend signing up for the patient portal called "MyChart".  Sign up information is provided on this After Visit Summary.  MyChart is used to connect with patients for Virtual Visits (Telemedicine).  Patients are able to view lab/test results, encounter notes, upcoming appointments, etc.  Non-urgent messages can be sent to your provider as well.   To learn more about what you can do with MyChart, go to [email or web address].    Your next appointment:   2 month(s)  The format for your next appointment:   In Person  Provider:   Melina Copa, PA-C        Other Instructions   Important Information About Sugar

## 2022-03-20 ENCOUNTER — Ambulatory Visit: Payer: BLUE CROSS/BLUE SHIELD | Admitting: Physician Assistant

## 2022-03-24 ENCOUNTER — Ambulatory Visit: Payer: BLUE CROSS/BLUE SHIELD | Admitting: Cardiology

## 2022-05-18 DIAGNOSIS — N72 Inflammatory disease of cervix uteri: Secondary | ICD-10-CM | POA: Diagnosis not present

## 2022-05-18 DIAGNOSIS — N959 Unspecified menopausal and perimenopausal disorder: Secondary | ICD-10-CM | POA: Diagnosis not present

## 2022-05-18 DIAGNOSIS — N841 Polyp of cervix uteri: Secondary | ICD-10-CM | POA: Diagnosis not present

## 2022-08-14 ENCOUNTER — Emergency Department (HOSPITAL_BASED_OUTPATIENT_CLINIC_OR_DEPARTMENT_OTHER): Payer: 59

## 2022-08-14 ENCOUNTER — Encounter (HOSPITAL_BASED_OUTPATIENT_CLINIC_OR_DEPARTMENT_OTHER): Payer: Self-pay | Admitting: Emergency Medicine

## 2022-08-14 ENCOUNTER — Other Ambulatory Visit: Payer: Self-pay

## 2022-08-14 ENCOUNTER — Emergency Department (HOSPITAL_BASED_OUTPATIENT_CLINIC_OR_DEPARTMENT_OTHER)
Admission: EM | Admit: 2022-08-14 | Discharge: 2022-08-14 | Disposition: A | Discharge status: Home / Self Care | Payer: 59 | Attending: Emergency Medicine | Admitting: Emergency Medicine

## 2022-08-14 DIAGNOSIS — W1789XA Other fall from one level to another, initial encounter: Secondary | ICD-10-CM | POA: Diagnosis not present

## 2022-08-14 DIAGNOSIS — Z7982 Long term (current) use of aspirin: Secondary | ICD-10-CM | POA: Insufficient documentation

## 2022-08-14 DIAGNOSIS — S93502A Unspecified sprain of left great toe, initial encounter: Secondary | ICD-10-CM | POA: Diagnosis not present

## 2022-08-14 DIAGNOSIS — S3991XA Unspecified injury of abdomen, initial encounter: Secondary | ICD-10-CM | POA: Diagnosis not present

## 2022-08-14 DIAGNOSIS — W1830XA Fall on same level, unspecified, initial encounter: Secondary | ICD-10-CM | POA: Diagnosis not present

## 2022-08-14 DIAGNOSIS — W19XXXA Unspecified fall, initial encounter: Secondary | ICD-10-CM | POA: Diagnosis not present

## 2022-08-14 DIAGNOSIS — S20212A Contusion of left front wall of thorax, initial encounter: Secondary | ICD-10-CM | POA: Diagnosis not present

## 2022-08-14 DIAGNOSIS — R109 Unspecified abdominal pain: Secondary | ICD-10-CM

## 2022-08-14 DIAGNOSIS — R1012 Left upper quadrant pain: Secondary | ICD-10-CM | POA: Diagnosis not present

## 2022-08-14 LAB — CBC
HCT: 38.7 % (ref 36.0–46.0)
Hemoglobin: 12.4 g/dL (ref 12.0–15.0)
MCH: 27.1 pg (ref 26.0–34.0)
MCHC: 32 g/dL (ref 30.0–36.0)
MCV: 84.7 fL (ref 80.0–100.0)
Platelets: 273 10*3/uL (ref 150–400)
RBC: 4.57 MIL/uL (ref 3.87–5.11)
RDW: 15.2 % (ref 11.5–15.5)
WBC: 9.4 10*3/uL (ref 4.0–10.5)
nRBC: 0 % (ref 0.0–0.2)

## 2022-08-14 LAB — BASIC METABOLIC PANEL
Anion gap: 7 (ref 5–15)
BUN: 17 mg/dL (ref 6–20)
CO2: 26 mmol/L (ref 22–32)
Calcium: 9.2 mg/dL (ref 8.9–10.3)
Chloride: 106 mmol/L (ref 98–111)
Creatinine, Ser: 0.7 mg/dL (ref 0.44–1.00)
GFR, Estimated: >60 mL/min (ref >60)
Glucose, Bld: 93 mg/dL (ref 70–99)
Potassium: 4.2 mmol/L (ref 3.5–5.1)
Sodium: 139 mmol/L (ref 135–145)

## 2022-08-14 MED ORDER — IOHEXOL 300 MG/ML  SOLN
100.0000 mL | Freq: Once | INTRAMUSCULAR | Status: AC | PRN
  Administered 2022-08-14: 100 mL via INTRAVENOUS

## 2022-08-14 NOTE — Discharge Instructions (Signed)
The CT scan did not show any signs of injury to your spleen.  Take ibuprofen or Naprosyn to help with your rib injury.  The symptoms should improve over the next week.

## 2022-08-14 NOTE — ED Provider Notes (Signed)
[place] [facility]GENCY DEPARTMENT AT [facility] Provider Note   CSN: [id number] Arrival date & time: 08/14/22  1944     History  Chief Complaint  Patient presents with   Marletta Lor    Wendy Barnett is a 52 y.o. female.   Fall     Patient had a fall earlier today.  Patient was on the stage when she tripped and fell about 4 feet.  Patient ended up landing on her rib area as well as her hands.  She went to an urgent care where she had x-rays of her chest and her foot.  Patient was told she might have a fracture of her great toe.  There is also possible fracture of her 11th rib.  Patient denies any abdominal pain but the provider raise the concern of possible splenic injury considering the location of her rib fracture.  Patient was instructed to go to the ED for further evaluation.  Home Medications Prior to Admission medications   Medication Sig Start Date End Date Taking? Authorizing Provider  aspirin EC 81 MG tablet Take 1 tablet (81 mg total) by mouth daily. 05/17/17   Jake Bathe, MD      Allergies    Patient has no known allergies.    Review of Systems   Review of Systems  Physical Exam Updated Vital Signs BP (!) 129/92   Pulse 71   Temp 97.8 F (36.6 C)   Resp 18   SpO2 98%  Physical Exam Vitals and nursing note reviewed.  Constitutional:      General: She is not in acute distress.    Appearance: She is well-developed.  HENT:     Head: Normocephalic and atraumatic.     Right Ear: External ear normal.     Left Ear: External ear normal.  Eyes:     General: No scleral icterus.       Right eye: No discharge.        Left eye: No discharge.     Conjunctiva/sclera: Conjunctivae normal.  Neck:     Trachea: No tracheal deviation.  Cardiovascular:     Rate and Rhythm: Normal rate and regular rhythm.  Pulmonary:     Effort: Pulmonary effort is normal. No respiratory distress.     Breath sounds: Normal breath sounds. No stridor. No wheezing or rales.   Chest:     Comments: Tenderness palpation left lower anterior rib margin Abdominal:     General: Bowel sounds are normal. There is no distension.     Palpations: Abdomen is soft.     Tenderness: There is no abdominal tenderness. There is no guarding or rebound.  Musculoskeletal:        General: No tenderness or deformity.     Cervical back: Neck supple.  Skin:    General: Skin is warm and dry.     Findings: No rash.  Neurological:     General: No focal deficit present.     Mental Status: She is alert.     Cranial Nerves: No cranial nerve deficit, dysarthria or facial asymmetry.     Sensory: No sensory deficit.     Motor: No abnormal muscle tone or seizure activity.     Coordination: Coordination normal.  Psychiatric:        Mood and Affect: Mood normal.     ED Results / Procedures / Treatments   Labs (all labs ordered are listed, but only abnormal results are displayed) Labs Reviewed  CBC  BASIC METABOLIC PANEL    EKG None  Radiology CT ABDOMEN PELVIS W CONTRAST  Result Date: 08/14/2022 CLINICAL DATA:  Blunt abdominal trauma EXAM: CT ABDOMEN AND PELVIS WITH CONTRAST TECHNIQUE: Multidetector CT imaging of the abdomen and pelvis was performed using the standard protocol following bolus administration of intravenous contrast. RADIATION DOSE REDUCTION: This exam was performed according to the departmental dose-optimization program which includes automated exposure control, adjustment of the mA and/or kV according to patient size and/or use of iterative reconstruction technique. CONTRAST:  OMNIPAQUE IOHEXOL 300 MG/ML  SOLN COMPARISON:  CT abdomen and pelvis 02/29/2012 FINDINGS: Lower chest: No acute abnormality. Hepatobiliary: No focal liver abnormality is seen. No gallstones, gallbladder wall thickening, or biliary dilatation. Pancreas: Unremarkable. No pancreatic ductal dilatation or surrounding inflammatory changes. Spleen: Normal in size without focal abnormality.  Adrenals/Urinary Tract: Adrenal glands are unremarkable. Kidneys are normal, without renal calculi, focal lesion, or hydronephrosis. Bladder is unremarkable. Stomach/Bowel: Stomach is within normal limits. Appendix appears normal. No evidence of bowel wall thickening, distention, or inflammatory changes. There is sigmoid colon diverticulosis. The appendix is not visualized. Vascular/Lymphatic: No significant vascular findings are present. No enlarged abdominal or pelvic lymph nodes. Reproductive: Uterus and bilateral adnexa are unremarkable. Other: No abdominal wall hernia or abnormality. No abdominopelvic ascites. Musculoskeletal: No acute or significant osseous findings. IMPRESSION: 1. No evidence for acute traumatic injury in the abdomen or pelvis. 2. Sigmoid colon diverticulosis. Electronically Signed   By: Darliss Cheney M.D.   On: 08/14/2022 21:43    Procedures Procedures    Medications Ordered in ED Medications  iohexol (OMNIPAQUE) 300 MG/ML solution 100 mL (100 mLs Intravenous Contrast Given 08/14/22 2124)    ED Course/ Medical Decision Making/ A&P Clinical Course as of 08/14/22 2234  Mon Aug 14, 2022  2203 CT scan without evidence of acute injury. [JK]  2203 CBC and metabolic panel normal [JK]    Clinical Course User Index [JK] Linwood Dibbles, MD                             Medical Decision Making Problems Addressed: Contusion of rib on left side, initial encounter: acute illness or injury that poses a threat to life or bodily functions Flank pain: acute illness or injury  Amount and/or Complexity of Data Reviewed External Data Reviewed: radiology and notes.    Details: Radiology reports and notes reviewed from the urgent care visit today.  Patient was diagnosed with a possible nondisplaced 11th rib fracture. Labs: ordered. Decision-making details documented in ED Course. Radiology: ordered and independent interpretation performed.  Risk Prescription drug management.   Patient  presented to ED for evaluation after a fall injuring her left rib area.  Patient had outpatient x-rays that suggested a rib fracture.  Patient also had x-ray of her foot where they identified a possible phalanx fracture.  Patient was sent to the ED for evaluation of possible spleen injury associated with rib fracture.  Patient's laboratory tests are reassuring.  No evidence of anemia.  CT scan was performed and there is fortunately no signs of splenic injury or rib fracture noted on CT scan.  Patient is stable for discharge.  Over-the-counter medications as needed for pain.        Final Clinical Impression(s) / ED Diagnoses Final diagnoses:  Flank pain  Contusion of rib on left side, initial encounter    Rx / DC Orders ED Discharge Orders     None  Linwood Dibbles, MD 08/14/22 2234

## 2022-08-14 NOTE — ED Triage Notes (Signed)
Fall from stage approx. 4.5 ft onto front chest. Evaluated at UC (nondisplaqce fx of left 11th rib) and told to get further w/u to r/o spleen injury.  Continued severe pain left upper abdo pain,
